# Patient Record
Sex: Male | Born: 1980 | Race: Black or African American | Hispanic: No | Marital: Single | Smoking: Never smoker
Health system: Southern US, Community
[De-identification: ages and names within clinical notes are randomized; demographics above are authoritative.]

---

## 1999-01-12 ENCOUNTER — Encounter: Payer: Self-pay | Admitting: *Deleted

## 1999-01-12 ENCOUNTER — Emergency Department (HOSPITAL_COMMUNITY): Admission: EM | Admit: 1999-01-12 | Discharge: 1999-01-12 | Payer: Self-pay | Admitting: Emergency Medicine

## 2006-11-20 ENCOUNTER — Emergency Department (HOSPITAL_COMMUNITY): Admission: EM | Admit: 2006-11-20 | Discharge: 2006-11-20 | Payer: Self-pay | Admitting: Emergency Medicine

## 2007-10-08 ENCOUNTER — Emergency Department (HOSPITAL_COMMUNITY): Admission: EM | Admit: 2007-10-08 | Discharge: 2007-10-08 | Payer: Self-pay | Admitting: Emergency Medicine

## 2009-06-07 IMAGING — CT CT ABDOMEN W/O CM
2 of 4 series · 16 of 46 positions shown, 18 images · IV contrast (agent unspecified)
Comparison: Plain films 11/20/06.

CLINICAL DATA: 26 year-old male with right lower quadrant pain, right flank pain.  History of kidney stones.  
 ABDOMEN CT WITHOUT CONTRAST:
TECHNIQUE: Multidetector CT imaging of the abdomen was performed following the standard protocol without IV contrast.
TECHNIQUE: Multidetector CT imaging of the pelvis was performed following the standard protocol without IV contrast.

[Series 2: use smart ma with max @ 130 · axial · 0.70mm/px · z∈[-445,-70]mm · 13 of 86 slices shown, 15 images]
[im 7/86  soft-tissue]
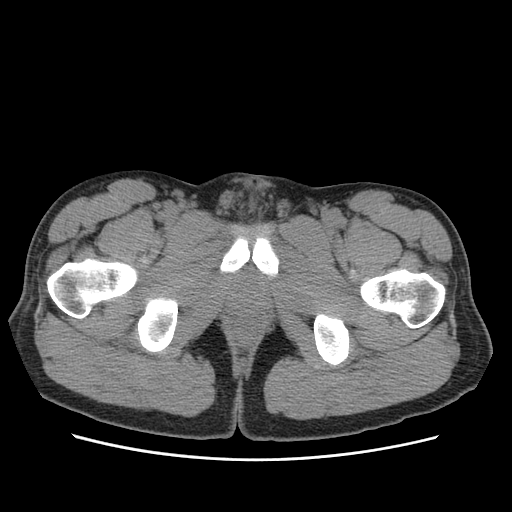
[im 7/86  bone]
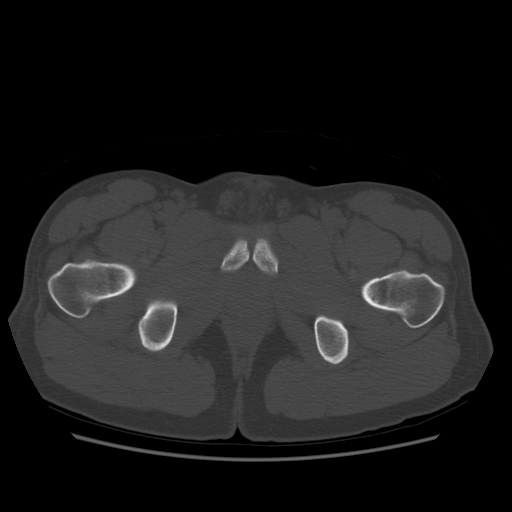
[im 13/86  soft-tissue]
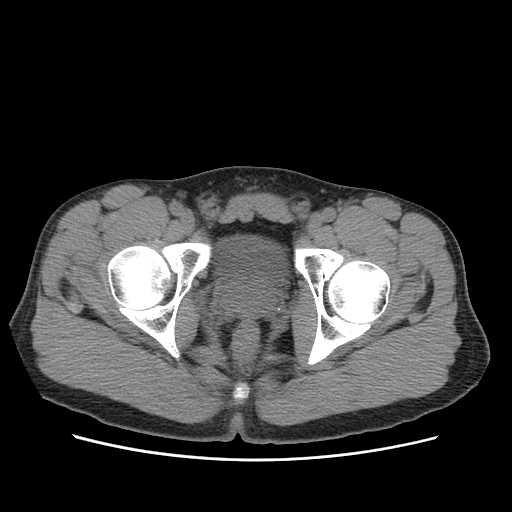
[im 19/86  soft-tissue]
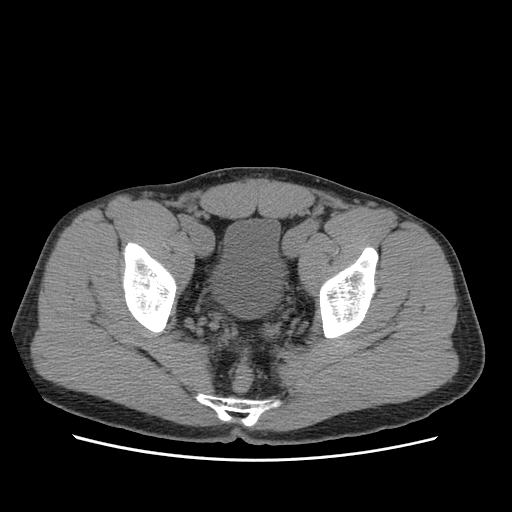
[im 26/86  soft-tissue]
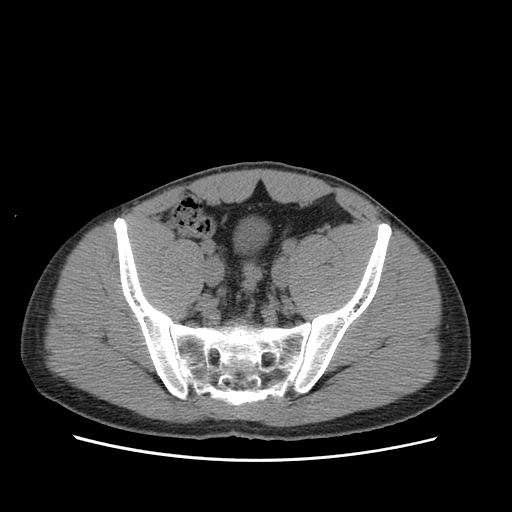
[im 32/86  soft-tissue]
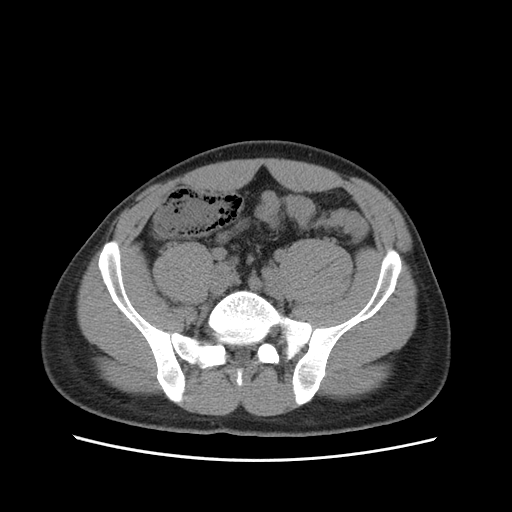
[im 38/86  soft-tissue]
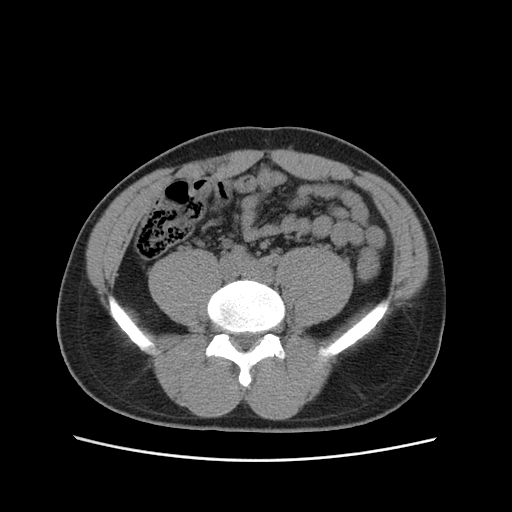
[im 45/86  soft-tissue]
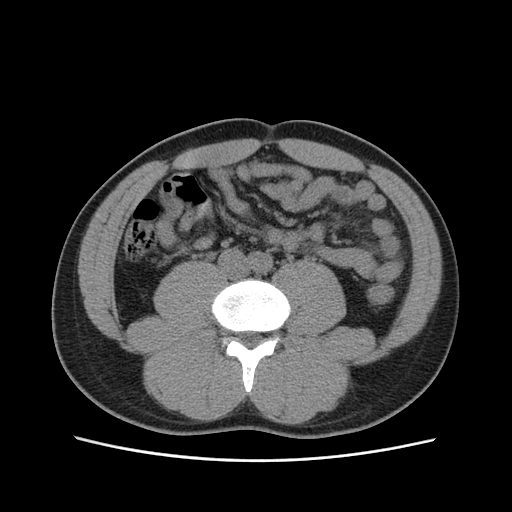
[im 51/86  soft-tissue]
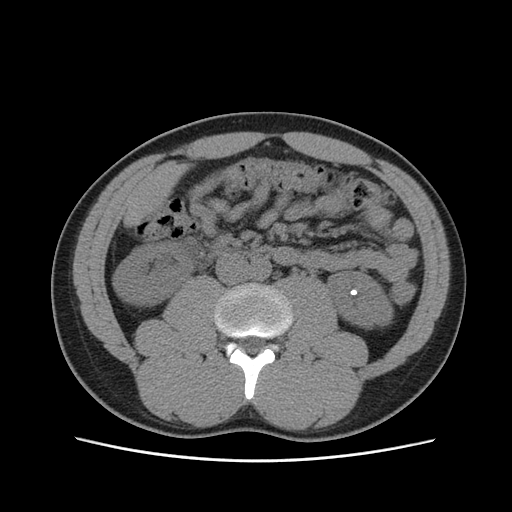
[im 57/86  soft-tissue]
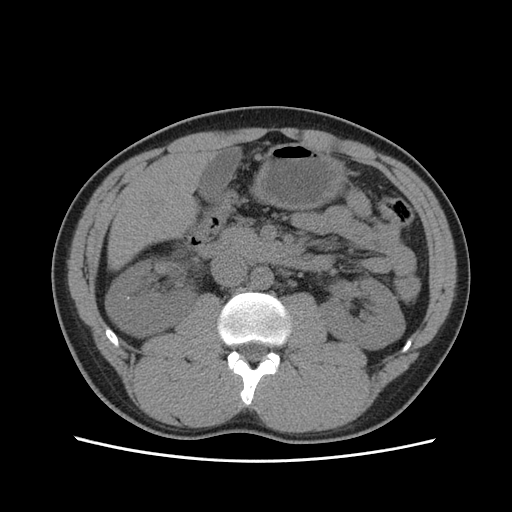
[im 57/86  bone]
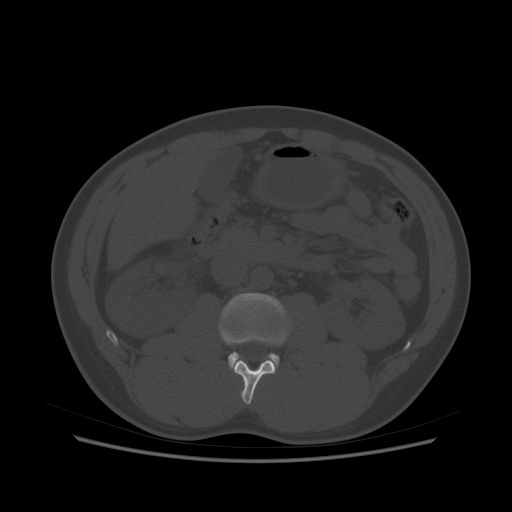
[im 63/86  soft-tissue]
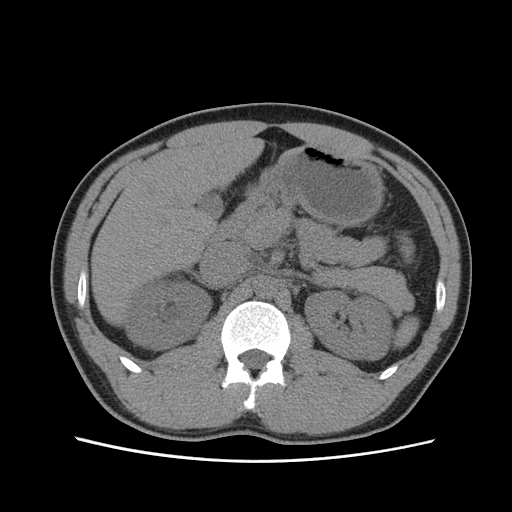
[im 70/86  soft-tissue]
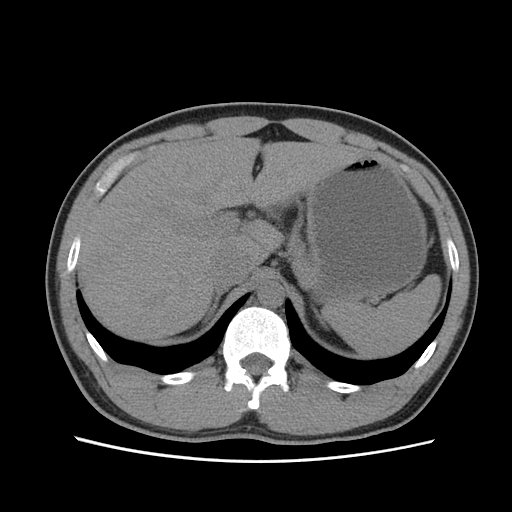
[im 76/86  soft-tissue]
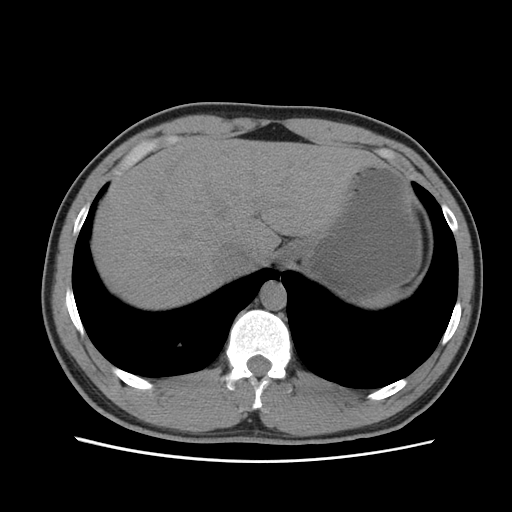
[im 82/86  soft-tissue]
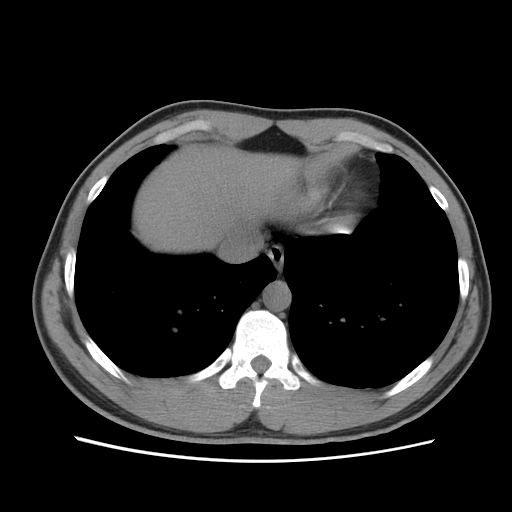

[Series 401: reformatted · coronal · 0.86mm/px · 3 of 125 slices shown]
[im 42/125  soft-tissue]
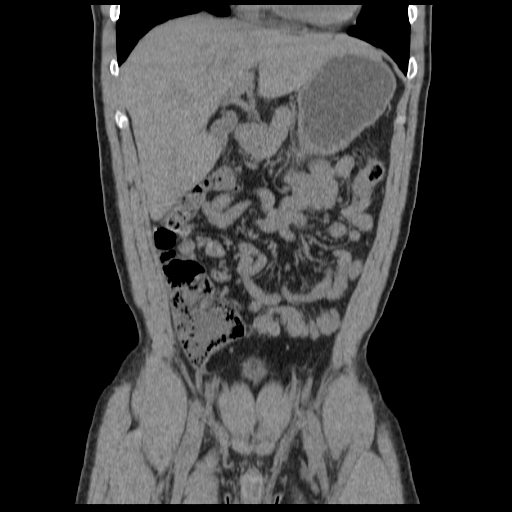
[im 56/125  soft-tissue]
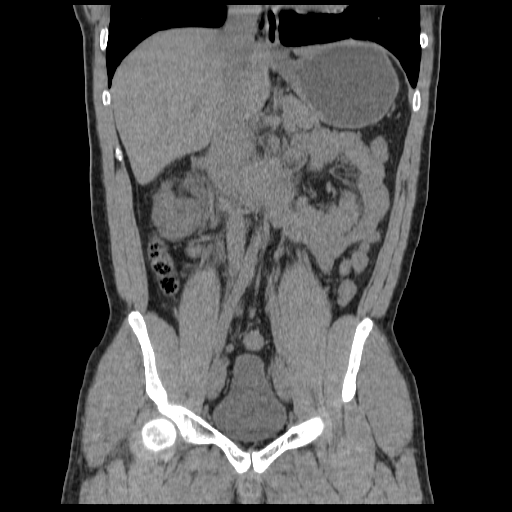
[im 69/125  soft-tissue]
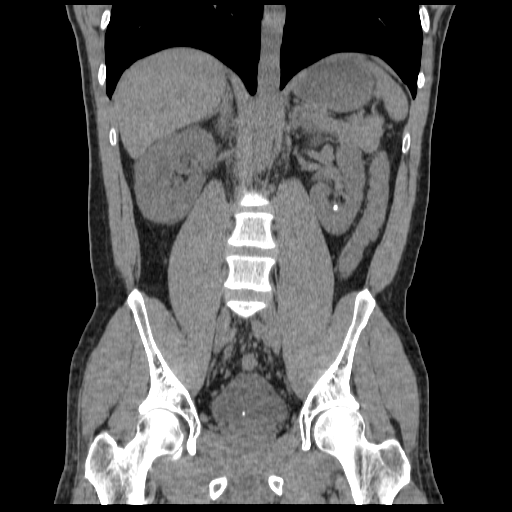

[16 of 46 positions shown; findings below may reference images not displayed]

FINDINGS: Visualized lung bases are clear.  No free air.  Normal noncontrasted appearance of the liver, gallbladder, spleen, pancreas, adrenal glands, and proximal bowel.  No osseous lesion.
 There are multiple nonobstructing intrarenal stones in the left kidney, the largest is in the lower pole measuring 5-6mm.  Approximately 4 calculi are identified on the left.  There is no left hydronephrosis or hydroureter, distal left ureter is not well delineated.  Multiple pelvic vascular calcifications are noted posteriorly, no suspicious calcification about the course of the left ureter.  On the right, there is hydronephrosis and perinephric stranding.  There is a punctate intrarenal calculus in the midpole.  The right ureter is dilated with periureteric stranding.  The right ureter is dilated to the ureterovesical junction.  A 3.5mm calculus is present in the posterior bladder, likely representing a recently passed stone.  The bladder is mildly distended.
IMPRESSION: 1.  Acute obstructive right uropathy likely due to recently passed right ureteral calculus now noted in the posterior bladder measuring 3.5mm.  
 2.  Multiple nonobstructing left intrarenal calculi.  
 PELVIS CT WITHOUT CONTRAST:
FINDINGS: Multiple pelvic vascular calcifications.  Mildly distended bladder with posteriorly located calculus as described above.  Normal visualized bowel.  No pelvic free fluid.  Nonenlarged inguinal nodes.  No suspicious osseous lesion.
IMPRESSION: Suspect recently passed right ureteral stone as described above.  Otherwise, no acute findings in the pelvis.

## 2011-01-25 LAB — URINALYSIS, ROUTINE W REFLEX MICROSCOPIC
Bilirubin Urine: NEGATIVE
Glucose, UA: NEGATIVE
Ketones, ur: 15 — AB
Leukocytes, UA: NEGATIVE
Protein, ur: NEGATIVE

## 2011-01-25 LAB — URINE MICROSCOPIC-ADD ON

## 2011-02-12 LAB — URINALYSIS, ROUTINE W REFLEX MICROSCOPIC
Nitrite: NEGATIVE
Protein, ur: 30 — AB
Specific Gravity, Urine: 1.022
Urobilinogen, UA: 1

## 2011-02-12 LAB — URINE MICROSCOPIC-ADD ON

## 2014-02-19 ENCOUNTER — Emergency Department (HOSPITAL_COMMUNITY)
Admission: EM | Admit: 2014-02-19 | Discharge: 2014-02-19 | Disposition: A | Discharge status: Home / Self Care | Payer: 59 | Attending: Emergency Medicine | Admitting: Emergency Medicine

## 2014-02-19 ENCOUNTER — Encounter (HOSPITAL_COMMUNITY): Payer: Self-pay | Admitting: Emergency Medicine

## 2014-02-19 ENCOUNTER — Emergency Department (HOSPITAL_COMMUNITY): Payer: 59

## 2014-02-19 DIAGNOSIS — R05 Cough: Secondary | ICD-10-CM | POA: Diagnosis present

## 2014-02-19 DIAGNOSIS — R059 Cough, unspecified: Secondary | ICD-10-CM

## 2014-02-19 DIAGNOSIS — S2231XA Fracture of one rib, right side, initial encounter for closed fracture: Secondary | ICD-10-CM | POA: Diagnosis not present

## 2014-02-19 DIAGNOSIS — J159 Unspecified bacterial pneumonia: Secondary | ICD-10-CM | POA: Insufficient documentation

## 2014-02-19 DIAGNOSIS — Y929 Unspecified place or not applicable: Secondary | ICD-10-CM | POA: Insufficient documentation

## 2014-02-19 DIAGNOSIS — Y9389 Activity, other specified: Secondary | ICD-10-CM | POA: Diagnosis not present

## 2014-02-19 DIAGNOSIS — X58XXXA Exposure to other specified factors, initial encounter: Secondary | ICD-10-CM | POA: Diagnosis not present

## 2014-02-19 DIAGNOSIS — R7989 Other specified abnormal findings of blood chemistry: Secondary | ICD-10-CM

## 2014-02-19 DIAGNOSIS — R791 Abnormal coagulation profile: Secondary | ICD-10-CM | POA: Insufficient documentation

## 2014-02-19 DIAGNOSIS — J189 Pneumonia, unspecified organism: Secondary | ICD-10-CM

## 2014-02-19 LAB — I-STAT CHEM 8, ED
BUN: 13 mg/dL (ref 6–23)
CHLORIDE: 108 meq/L (ref 96–112)
Calcium, Ion: 1.12 mmol/L (ref 1.12–1.23)
Creatinine, Ser: 1.1 mg/dL (ref 0.50–1.35)
GLUCOSE: 100 mg/dL — AB (ref 70–99)
HEMATOCRIT: 47 % (ref 39.0–52.0)
Hemoglobin: 16 g/dL (ref 13.0–17.0)
POTASSIUM: 3.6 meq/L — AB (ref 3.7–5.3)
Sodium: 142 mEq/L (ref 137–147)
TCO2: 23 mmol/L (ref 0–100)

## 2014-02-19 LAB — CBC WITH DIFFERENTIAL/PLATELET
BASOS PCT: 1 % (ref 0–1)
Basophils Absolute: 0.1 10*3/uL (ref 0.0–0.1)
Eosinophils Absolute: 0.2 10*3/uL (ref 0.0–0.7)
Eosinophils Relative: 3 % (ref 0–5)
HCT: 41.9 % (ref 39.0–52.0)
HEMOGLOBIN: 15.1 g/dL (ref 13.0–17.0)
LYMPHS PCT: 40 % (ref 12–46)
Lymphs Abs: 3.1 10*3/uL (ref 0.7–4.0)
MCH: 29.7 pg (ref 26.0–34.0)
MCHC: 36 g/dL (ref 30.0–36.0)
MCV: 82.5 fL (ref 78.0–100.0)
MONOS PCT: 9 % (ref 3–12)
Monocytes Absolute: 0.7 10*3/uL (ref 0.1–1.0)
NEUTROS ABS: 3.7 10*3/uL (ref 1.7–7.7)
NEUTROS PCT: 47 % (ref 43–77)
Platelets: 237 10*3/uL (ref 150–400)
RBC: 5.08 MIL/uL (ref 4.22–5.81)
RDW: 12.5 % (ref 11.5–15.5)
WBC: 7.9 10*3/uL (ref 4.0–10.5)

## 2014-02-19 LAB — D-DIMER, QUANTITATIVE: D-Dimer, Quant: 0.78 ug/mL-FEU — ABNORMAL HIGH (ref 0.00–0.48)

## 2014-02-19 LAB — I-STAT TROPONIN, ED: TROPONIN I, POC: 0.01 ng/mL (ref 0.00–0.08)

## 2014-02-19 LAB — PRO B NATRIURETIC PEPTIDE: PRO B NATRI PEPTIDE: 12.2 pg/mL (ref 0–125)

## 2014-02-19 MED ORDER — OXYCODONE-ACETAMINOPHEN 5-325 MG PO TABS
1.0000 | ORAL_TABLET | Freq: Once | ORAL | Status: AC
  Administered 2014-02-19: 1 via ORAL
  Filled 2014-02-19: qty 1

## 2014-02-19 MED ORDER — ALBUTEROL SULFATE HFA 108 (90 BASE) MCG/ACT IN AERS: 1.0000 | INHALATION_SPRAY | Freq: Four times a day (QID) | RESPIRATORY_TRACT | Status: AC | PRN

## 2014-02-19 MED ORDER — OXYCODONE-ACETAMINOPHEN 5-325 MG PO TABS: 1.0000 | ORAL_TABLET | Freq: Four times a day (QID) | ORAL | Status: DC | PRN

## 2014-02-19 MED ORDER — LEVOFLOXACIN 750 MG PO TABS
750.0000 mg | ORAL_TABLET | Freq: Once | ORAL | Status: AC
  Administered 2014-02-19: 750 mg via ORAL
  Filled 2014-02-19: qty 1

## 2014-02-19 MED ORDER — LEVOFLOXACIN 750 MG PO TABS: 750.0000 mg | ORAL_TABLET | Freq: Every day | ORAL | Status: DC

## 2014-02-19 MED ORDER — KETOROLAC TROMETHAMINE 30 MG/ML IJ SOLN
30.0000 mg | Freq: Once | INTRAMUSCULAR | Status: AC
  Administered 2014-02-19: 30 mg via INTRAVENOUS
  Filled 2014-02-19: qty 1

## 2014-02-19 MED ORDER — IOHEXOL 350 MG/ML SOLN
100.0000 mL | Freq: Once | INTRAVENOUS | Status: AC | PRN
  Administered 2014-02-19: 100 mL via INTRAVENOUS

## 2014-02-19 NOTE — ED Notes (Signed)
Dr. Terressa KoyanagiPalumbo-Rasch at bedside.

## 2014-02-19 NOTE — ED Notes (Signed)
Pt. reports persistent productive cough with chest congestion for 2 weeks , denies fever or chills. Respirations unlabored.

## 2014-02-19 NOTE — ED Provider Notes (Signed)
CSN: [id number]     Arrival date & time 02/19/14  0102 History   First MD Initiated Contact with Patient 02/19/14 0129     Chief Complaint  Patient presents with  . Cough     (Consider location/radiation/quality/duration/timing/severity/associated sxs/prior Treatment) Patient is a 33 y.o. male presenting with cough. The history is provided by the patient.  Cough Cough characteristics:  Productive Sputum characteristics:  Yellow Severity:  Moderate Onset quality:  Gradual Timing:  Intermittent Progression:  Unchanged Chronicity:  New Smoker: no   Context: not fumes   Relieved by:  Nothing Worsened by:  Nothing tried Ineffective treatments:  None tried Associated symptoms: wheezing   Associated symptoms: no shortness of breath   Associated symptoms comment:  Chest pain constant x 24 hours Risk factors: no recent travel     History reviewed. No pertinent past medical history. History reviewed. No pertinent past surgical history. No family history on file. History  Substance Use Topics  . Smoking status: Never Smoker   . Smokeless tobacco: Not on file  . Alcohol Use: Yes    Review of Systems  Respiratory: Positive for cough and wheezing. Negative for shortness of breath.   All other systems reviewed and are negative.     Allergies  Review of patient's allergies indicates no known allergies.  Home Medications   Prior to Admission medications   Medication Sig Start Date End Date Taking? Authorizing Provider  ibuprofen (ADVIL,MOTRIN) 200 MG tablet Take 200 mg by mouth every 6 (six) hours as needed for mild pain.   Yes Historical Provider, MD   BP 132/92  Pulse 83  Temp(Src) 98.2 F (36.8 C) (Oral)  Resp 18  Ht 5\' 7"  (1.702 m)  Wt 175 lb (79.379 kg)  BMI 27.40 kg/m2  SpO2 98% Physical Exam  Constitutional: He is oriented to person, place, and time. He appears well-developed and well-nourished. No distress.  HENT:  Head: Normocephalic and atraumatic.   Mouth/Throat: Oropharynx is clear and moist.  Eyes: Conjunctivae are normal. Pupils are equal, round, and reactive to light.  Neck: Normal range of motion. Neck supple.  Cardiovascular: Normal rate, regular rhythm and intact distal pulses.   Pulmonary/Chest: Effort normal and breath sounds normal. No respiratory distress. He has no wheezes. He has no rales.  Abdominal: Soft. Bowel sounds are normal. There is no tenderness. There is no rebound and no guarding.  Musculoskeletal: Normal range of motion.  Neurological: He is alert and oriented to person, place, and time.  Skin: Skin is warm and dry.  Psychiatric: He has a normal mood and affect.    ED Course  Procedures (including critical care time) Labs Review Labs Reviewed  D-DIMER, QUANTITATIVE - Abnormal; Notable for the following:    D-Dimer, Quant 0.78 (*)    All other components within normal limits  I-STAT CHEM 8, ED - Abnormal; Notable for the following:    Potassium 3.6 (*)    Glucose, Bld 100 (*)    All other components within normal limits  CBC WITH DIFFERENTIAL  PRO B NATRIURETIC PEPTIDE  I-STAT TROPOININ, ED    Imaging Review Dg Chest 2 View  02/19/2014   CLINICAL DATA:  Cough for the past week. Left chest pain. Productive cough.  EXAM: CHEST  2 VIEW  COMPARISON:  None.  FINDINGS: Shallow inspiration. Borderline heart size with increased pulmonary vascularity. Interstitial changes in the lungs suggesting edema or interstitial pneumonitis. No focal consolidation. No blunting of costophrenic angles. No pneumothorax. Mediastinal contours  appear intact.  IMPRESSION: Mild vascular congestion with interstitial changes suggesting edema or diffuse interstitial pneumonitis.   Electronically Signed   By: Burman NievesWilliam  Stevens M.D.   On: 02/19/2014 01:56     EKG Interpretation   Date/Time:  Friday February 19 2014 02:17:56 EDT Ventricular Rate:  89 PR Interval:  141 QRS Duration: 86 QT Interval:  352 QTC Calculation: 428 R  Axis:   76 Text Interpretation:  Sinus rhythm RSR' in V1 or V2, probably normal  variant Confirmed by [facility]  MD, Yair Dusza ([id number]) on 02/19/2014  3:15:29 AM      MDM   Final diagnoses:  D-dimer, elevated    Negative BNP.  No PE will treat for pneumonia and rib fracture with pain medication and incentive spirometer.  Will treat with 7 days of levaquin.  Follow up with your PMD within 48 hours return for CP shortness of breath or any concerns.  Patient and mother verbalize understanding and agree to follow up   Makailyn Mccormick Smitty CordsK Lawan Nanez-Rasch, MD 02/19/14 [phone]

## 2015-03-14 ENCOUNTER — Emergency Department (INDEPENDENT_AMBULATORY_CARE_PROVIDER_SITE_OTHER)
Admission: EM | Admit: 2015-03-14 | Discharge: 2015-03-14 | Disposition: A | Discharge status: Home / Self Care | Payer: BLUE CROSS/BLUE SHIELD | Source: Home / Self Care

## 2015-03-14 ENCOUNTER — Encounter (HOSPITAL_COMMUNITY): Payer: Self-pay

## 2015-03-14 DIAGNOSIS — R52 Pain, unspecified: Secondary | ICD-10-CM

## 2015-03-14 NOTE — ED Notes (Signed)
C/o he feels achy all over, denies fever, using Nyquil for symptoms w minimal relief

## 2015-03-14 NOTE — Discharge Instructions (Signed)
Continue to treat herself symptomatically. This includes fluids, ibuprofen. There is no indication for antibiotics at this time. As discussed in appropriate use of antibiotics can be harmful  It is also advised that she get a flu shot sometime in the near future.

## 2016-06-07 ENCOUNTER — Encounter (HOSPITAL_COMMUNITY): Payer: Self-pay | Admitting: Emergency Medicine

## 2016-06-07 ENCOUNTER — Ambulatory Visit (HOSPITAL_COMMUNITY)
Admission: EM | Admit: 2016-06-07 | Discharge: 2016-06-07 | Disposition: A | Discharge status: Home / Self Care | Payer: BLUE CROSS/BLUE SHIELD | Attending: Emergency Medicine | Admitting: Emergency Medicine

## 2016-06-07 DIAGNOSIS — R69 Illness, unspecified: Secondary | ICD-10-CM

## 2016-06-07 DIAGNOSIS — J111 Influenza due to unidentified influenza virus with other respiratory manifestations: Secondary | ICD-10-CM

## 2016-06-07 MED ORDER — ACETAMINOPHEN 325 MG PO TABS
ORAL_TABLET | ORAL | Status: AC
  Filled 2016-06-07: qty 2

## 2016-06-07 MED ORDER — BENZONATATE 100 MG PO CAPS: 100.0000 mg | ORAL_CAPSULE | Freq: Three times a day (TID) | ORAL | 0 refills | Status: DC

## 2016-06-07 MED ORDER — OSELTAMIVIR PHOSPHATE 75 MG PO CAPS: 75.0000 mg | ORAL_CAPSULE | Freq: Two times a day (BID) | ORAL | 0 refills | Status: DC

## 2016-06-07 MED ORDER — ACETAMINOPHEN 325 MG PO TABS
650.0000 mg | ORAL_TABLET | Freq: Once | ORAL | Status: AC
  Administered 2016-06-07: 650 mg via ORAL

## 2016-06-07 NOTE — ED Triage Notes (Signed)
Pt complains of weakness, cough and fever for two days.  Pt is here with a fever of 102.9 today.

## 2016-06-07 NOTE — Discharge Instructions (Signed)
You most likely have a viral URI, I advise rest, plenty of fluids and management of symptoms with over the counter medicines. For symptoms you may take Tylenol as needed every 4-6 hours for body aches or fever, not to exceed 4,000 mg a day, Take mucinex or mucinex DM ever 12 hours with a full glass of water, you may use an inhaled steroid such as Flonase, 2 sprays each nostril once a day for congestion, or an antihistamine such as Claritin or Zyrtec once a day. For cough, I have prescribed a medication called Tessalon. Take 1 tablet every 8 hours as needed for your cough. For treatment of influenza, I have prescribed Tamiflu. Take 1 tablet twice a day for 5 days. Should your symptoms worsen or fail to resolve, follow up with your primary care provider or return to clinic.

## 2016-06-07 NOTE — ED Provider Notes (Signed)
CSN: [id number]     Arrival date & time 06/07/16  1608 History   First MD Initiated Contact with Patient 06/07/16 1732     Chief Complaint  Patient presents with  . Influenza   (Consider location/radiation/quality/duration/timing/severity/associated sxs/prior Treatment) 24 hour history of cough, congestion, fever, muscle aches, body aches, fatigue, weakness, loss of appetite, no nausea, vomiting, or diarrhea. Cough is dry hacking, non-productive with clear sputum. He has taken OTC robatussin with some relief.    The history is provided by the patient.  Influenza    History reviewed. No pertinent past medical history. History reviewed. No pertinent surgical history. History reviewed. No pertinent family history. Social History  Substance Use Topics  . Smoking status: Never Smoker  . Smokeless tobacco: Never Used  . Alcohol use Yes     Comment: occasional    Review of Systems  Reason unable to perform ROS: as covered in HPI.  All other systems reviewed and are negative.   Allergies  Patient has no known allergies.  Home Medications   Prior to Admission medications   Medication Sig Start Date End Date Taking? Authorizing Provider  albuterol (PROVENTIL HFA;VENTOLIN HFA) 108 (90 BASE) MCG/ACT inhaler Inhale 1-2 puffs into the lungs every 6 (six) hours as needed for wheezing or shortness of breath. 02/19/14   April Palumbo, MD  benzonatate (TESSALON) 100 MG capsule Take 1 capsule (100 mg total) by mouth every 8 (eight) hours. 06/07/16   Dorena Bodo, NP  ibuprofen (ADVIL,MOTRIN) 200 MG tablet Take 200 mg by mouth every 6 (six) hours as needed for mild pain.    Historical Provider, MD  levofloxacin (LEVAQUIN) 750 MG tablet Take 1 tablet (750 mg total) by mouth daily. X 7 days 02/19/14   April Palumbo, MD  oseltamivir (TAMIFLU) 75 MG capsule Take 1 capsule (75 mg total) by mouth every 12 (twelve) hours. 06/07/16   Dorena Bodo, NP  oxyCODONE-acetaminophen (PERCOCET) 5-325 MG per  tablet Take 1-2 tablets by mouth every 6 (six) hours as needed. 02/19/14   April Palumbo, MD   Meds Ordered and Administered this Visit   Medications  acetaminophen (TYLENOL) tablet 650 mg (650 mg Oral Given 06/07/16 1647)    BP 121/69 (BP Location: Right Arm)   Pulse 111   Temp 102.9 F (39.4 C) (Oral)   SpO2 95%  No data found.   Physical Exam  Constitutional: He is oriented to person, place, and time. He appears well-developed and well-nourished. He appears ill. No distress.  HENT:  Head: Normocephalic and atraumatic.  Right Ear: Tympanic membrane and external ear normal.  Left Ear: Tympanic membrane and external ear normal.  Nose: Nose normal. Right sinus exhibits no maxillary sinus tenderness and no frontal sinus tenderness. Left sinus exhibits no maxillary sinus tenderness and no frontal sinus tenderness.  Mouth/Throat: Uvula is midline and oropharynx is clear and moist. No oropharyngeal exudate.  Eyes: Pupils are equal, round, and reactive to light.  Neck: Normal range of motion. Neck supple. No JVD present.  Cardiovascular: Normal rate and regular rhythm.   Pulmonary/Chest: Effort normal and breath sounds normal. No respiratory distress. He has no wheezes. He has no rhonchi.  Abdominal: Soft. Bowel sounds are normal.  Lymphadenopathy:       Head (right side): No submental, no submandibular and no tonsillar adenopathy present.       Head (left side): No submental, no submandibular and no tonsillar adenopathy present.    He has no cervical adenopathy.  Neurological: He  is alert and oriented to person, place, and time.  Skin: Skin is warm and dry. Capillary refill takes less than 2 seconds. No rash noted. He is not diaphoretic. No erythema.  Psychiatric: He has a normal mood and affect.  Nursing note and vitals reviewed.   Urgent Care Course     Procedures (including critical care time)  Labs Review Labs Reviewed - No data to display  Imaging Review No results  found.   Visual Acuity Review  Right Eye Distance:   Left Eye Distance:   Bilateral Distance:    Right Eye Near:   Left Eye Near:    Bilateral Near:         MDM   1. Influenza-like illness   You most likely have a viral URI, I advise rest, plenty of fluids and management of symptoms with over the counter medicines. For symptoms you may take Tylenol as needed every 4-6 hours for body aches or fever, not to exceed 4,000 mg a day, Take mucinex or mucinex DM ever 12 hours with a full glass of water, you may use an inhaled steroid such as Flonase, 2 sprays each nostril once a day for congestion, or an antihistamine such as Claritin or Zyrtec once a day. For cough, I have prescribed a medication called Tessalon. Take 1 tablet every 8 hours as needed for your cough. For treatment of influenza, I have prescribed Tamiflu. Take 1 tablet twice a day for 5 days. Should your symptoms worsen or fail to resolve, follow up with your primary care provider or return to clinic.      Dorena BodoLawrence Rhiana Morash, NP 06/07/16 1743

## 2017-10-31 ENCOUNTER — Encounter (HOSPITAL_COMMUNITY): Payer: Self-pay

## 2017-10-31 ENCOUNTER — Ambulatory Visit (HOSPITAL_COMMUNITY)
Admission: EM | Admit: 2017-10-31 | Discharge: 2017-10-31 | Disposition: A | Discharge status: Home / Self Care | Payer: BLUE CROSS/BLUE SHIELD | Attending: Family Medicine | Admitting: Family Medicine

## 2017-10-31 DIAGNOSIS — H1033 Unspecified acute conjunctivitis, bilateral: Secondary | ICD-10-CM

## 2017-10-31 MED ORDER — TOBRAMYCIN 0.3 % OP SOLN: 1.0000 [drp] | OPHTHALMIC | 0 refills | Status: AC

## 2017-10-31 NOTE — Discharge Instructions (Signed)
Use eye drops as directed Try to go without contacts for a couple of days Return if you fail to improve quickly

## 2017-10-31 NOTE — ED Provider Notes (Signed)
[facility]    CSN: [id number] Arrival date & time: 10/31/17  1317     History   Chief Complaint Chief Complaint  Patient presents with  . Eye Problem    HPI Dale Pittman is a 37 y.o. male.   HPI Patient is here for "pinkeye".  A 38-year-old which is treated for this with eyedrops last week.  He states that since yesterday he has had itching and irritation of his eyes.  He woke up this morning and they were more irritated, had yellow crusting, and he noticed that they were "bloodshot".  His vision is normal.  No cold symptoms runny nose.  No fever or chills.  No chemical or dust exposure.  He is otherwise in good health and on no medications. He does wear contact lenses.  He put contact lenses in today.  I told whenever possible contact lens wear need to discontinue the lenses during treatment.  He tells me he does not have glasses and cannot see without the contact lenses.  We told him he needs to contact his eye doctor about glasses for emergency use. History reviewed. No pertinent past medical history.  There are no active problems to display for this patient.   History reviewed. No pertinent surgical history.     Home Medications    Prior to Admission medications   Medication Sig Start Date End Date Taking? Authorizing Provider  albuterol (PROVENTIL HFA;VENTOLIN HFA) 108 (90 BASE) MCG/ACT inhaler Inhale 1-2 puffs into the lungs every 6 (six) hours as needed for wheezing or shortness of breath. 02/19/14   Palumbo, April, MD  ibuprofen (ADVIL,MOTRIN) 200 MG tablet Take 200 mg by mouth every 6 (six) hours as needed for mild pain.    [provider]  tobramycin (TOBREX) 0.3 % ophthalmic solution Place 1 drop into both eyes every 4 (four) hours. 10/31/17   Eustace Moore, MD    Family History Family History  Problem Relation Age of Onset  . Healthy Mother     Social History Social History   Tobacco Use  . Smoking status: Never Smoker  .  Smokeless tobacco: Never Used  Substance Use Topics  . Alcohol use: Yes    Comment: occasional  . Drug use: No     Allergies   Patient has no known allergies.   Review of Systems Review of Systems  Constitutional: Negative for chills and fever.  HENT: Negative for congestion, ear pain, postnasal drip, rhinorrhea, sinus pressure and sore throat.   Eyes: Positive for discharge, redness and itching. Negative for photophobia, pain and visual disturbance.  Respiratory: Negative for cough and shortness of breath.   Cardiovascular: Negative for chest pain and palpitations.  Gastrointestinal: Negative for abdominal pain and vomiting.  Genitourinary: Negative for dysuria and hematuria.  Musculoskeletal: Negative for arthralgias and back pain.  Skin: Negative for color change and rash.  Neurological: Negative for seizures and syncope.  All other systems reviewed and are negative.    Physical Exam Triage Vital Signs ED Triage Vitals  Enc Vitals Group     BP 10/31/17 1355 (!) 141/94     Pulse Rate 10/31/17 1355 75     Resp 10/31/17 1353 20     Temp 10/31/17 1355 98.9 F (37.2 C)     Temp Source 10/31/17 1353 Oral     SpO2 10/31/17 1355 96 %     Weight --      Height --      Head  Circumference --      Peak Flow --      Pain Score --      Pain Loc --      Pain Edu? --      Excl. in GC? --    No data found.  Updated Vital Signs BP (!) 141/94 (BP Location: Right Arm)   Pulse 75   Temp 98.9 F (37.2 C) (Oral)   Resp 20   SpO2 96%      Physical Exam  Constitutional: He appears well-developed and well-nourished. No distress.  HENT:  Head: Normocephalic and atraumatic.  Right Ear: External ear normal.  Left Ear: External ear normal.  Mouth/Throat: Oropharynx is clear and moist.  Eyes: Pupils are equal, round, and reactive to light. Lids are normal. Right eye exhibits discharge. Right eye exhibits no chemosis, no exudate and no hordeolum. No foreign body present in the  right eye. Left eye exhibits discharge. Left eye exhibits no chemosis, no exudate and no hordeolum. No foreign body present in the left eye. Right conjunctiva is injected. Right conjunctiva has no hemorrhage. Left conjunctiva is injected. Left conjunctiva has no hemorrhage. No scleral icterus.  Neck: Normal range of motion. Neck supple.  Cardiovascular: Normal rate.  Pulmonary/Chest: Effort normal. No respiratory distress.  Abdominal: Soft. He exhibits no distension.  Musculoskeletal: Normal range of motion. He exhibits no edema.  Lymphadenopathy:    He has no cervical adenopathy.  Neurological: He is alert.  Skin: Skin is warm and dry.     UC Treatments / Results  Labs (all labs ordered are listed, but only abnormal results are displayed) Labs Reviewed - No data to display  EKG None  Radiology No results found.  Procedures Procedures (including critical care time)  Medications Ordered in UC Medications - No data to display  Initial Impression / Assessment and Plan / UC Course  I have reviewed the triage vital signs and the nursing notes.  Pertinent labs & imaging results that were available during my care of the patient were reviewed by me and considered in my medical decision making (see chart for details).     Discussed bacterial conjunctivitis.  Contagiousness.  Treatment.  Handwashing.  Work considerations.  Contact lenses. Final Clinical Impressions(s) / UC Diagnoses   Final diagnoses:  Acute bacterial conjunctivitis of both eyes     Discharge Instructions     Use eye drops as directed Try to go without contacts for a couple of days Return if you fail to improve quickly   ED Prescriptions    Medication Sig Dispense Auth. Provider   tobramycin (TOBREX) 0.3 % ophthalmic solution Place 1 drop into both eyes every 4 (four) hours. 5 mL Eustace MooreNelson, Shion Bluestein Sue, MD     Controlled Substance Prescriptions [place] Controlled Substance Registry consulted? Not Applicable     Eustace MooreNelson, Alandra Sando Sue, MD 10/31/17 [phone]

## 2017-10-31 NOTE — ED Triage Notes (Signed)
Pt presents with eye irritation of both eyes.
